# Patient Record
Sex: Female | Born: 2000 | Race: White | Hispanic: No | Marital: Single | Smoking: Former smoker
Health system: Southern US, Community
[De-identification: ages and names within clinical notes are randomized; demographics above are authoritative.]

## PROBLEM LIST (undated history)

## (undated) DIAGNOSIS — F329 Major depressive disorder, single episode, unspecified: Secondary | ICD-10-CM

## (undated) DIAGNOSIS — F332 Major depressive disorder, recurrent severe without psychotic features: Secondary | ICD-10-CM

## (undated) DIAGNOSIS — F32A Depression, unspecified: Secondary | ICD-10-CM

## (undated) HISTORY — DX: Depression, unspecified: F32.A

## (undated) HISTORY — DX: Major depressive disorder, single episode, unspecified: F32.9

## (undated) HISTORY — DX: Major depressive disorder, recurrent severe without psychotic features: F33.2

## (undated) HISTORY — PX: TONSILLECTOMY: SUR1361

---
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
## 2020-02-09 ENCOUNTER — Encounter: Payer: Self-pay | Admitting: Psychiatry

## 2020-03-20 ENCOUNTER — Other Ambulatory Visit: Payer: Self-pay | Admitting: Family Medicine

## 2020-04-03 ENCOUNTER — Telehealth: Payer: Self-pay

## 2020-04-03 NOTE — Telephone Encounter (Signed)
Patient is requesting to be worked in sooner to discuss putting her on anxiety medication. Please advise

## 2020-04-04 NOTE — Telephone Encounter (Signed)
Lvm for pt to call the office back. 

## 2020-04-06 NOTE — Telephone Encounter (Signed)
2nd attempt to contact pt.

## 2020-04-06 NOTE — Telephone Encounter (Signed)
She was followed by Dr. Marlyne Beards and I also did referral to psychiatry. Did she ever follow up with them? Can maybe do next week?  Dr. Artis Flock

## 2020-04-11 NOTE — Telephone Encounter (Signed)
3rd attempt to reach pt. Lvm for pt to call the office back.

## 2020-04-17 ENCOUNTER — Ambulatory Visit: Payer: 59 | Admitting: Family Medicine

## 2020-04-17 DIAGNOSIS — Z0289 Encounter for other administrative examinations: Secondary | ICD-10-CM

## 2020-05-02 ENCOUNTER — Other Ambulatory Visit: Payer: Self-pay | Admitting: Family Medicine

## 2020-09-19 ENCOUNTER — Telehealth: Payer: Self-pay

## 2020-09-19 NOTE — Telephone Encounter (Signed)
Pt mother called stating pt is experiencing a fever and sore throat. Pts brother currently has strep and mother thinks pt may have strep now. Mother is asking if an antibiotic can be sent in for the pt. Please advise.

## 2020-09-19 NOTE — Telephone Encounter (Signed)
Spoke with patient mom, patient at Urgent Care at this time

## 2020-09-21 ENCOUNTER — Other Ambulatory Visit: Payer: Self-pay

## 2020-09-21 ENCOUNTER — Emergency Department (HOSPITAL_BASED_OUTPATIENT_CLINIC_OR_DEPARTMENT_OTHER): Payer: 59 | Admitting: Radiology

## 2020-09-21 ENCOUNTER — Emergency Department (HOSPITAL_BASED_OUTPATIENT_CLINIC_OR_DEPARTMENT_OTHER)
Admission: EM | Admit: 2020-09-21 | Discharge: 2020-09-21 | Disposition: A | Discharge status: Home / Self Care | Payer: 59 | Attending: Emergency Medicine | Admitting: Emergency Medicine

## 2020-09-21 ENCOUNTER — Encounter (HOSPITAL_BASED_OUTPATIENT_CLINIC_OR_DEPARTMENT_OTHER): Payer: Self-pay | Admitting: Obstetrics and Gynecology

## 2020-09-21 DIAGNOSIS — Z87891 Personal history of nicotine dependence: Secondary | ICD-10-CM | POA: Insufficient documentation

## 2020-09-21 DIAGNOSIS — Z20822 Contact with and (suspected) exposure to covid-19: Secondary | ICD-10-CM | POA: Insufficient documentation

## 2020-09-21 DIAGNOSIS — J1089 Influenza due to other identified influenza virus with other manifestations: Secondary | ICD-10-CM | POA: Insufficient documentation

## 2020-09-21 DIAGNOSIS — R059 Cough, unspecified: Secondary | ICD-10-CM | POA: Diagnosis present

## 2020-09-21 DIAGNOSIS — J101 Influenza due to other identified influenza virus with other respiratory manifestations: Secondary | ICD-10-CM

## 2020-09-21 LAB — RESP PANEL BY RT-PCR (FLU A&B, COVID) ARPGX2
Influenza A by PCR: POSITIVE — AB
Influenza B by PCR: NEGATIVE
SARS Coronavirus 2 by RT PCR: NEGATIVE

## 2020-09-21 MED ORDER — HYDROCOD POLST-CPM POLST ER 10-8 MG/5ML PO SUER: 5.0000 mL | Freq: Every evening | ORAL | 0 refills | Status: DC | PRN

## 2020-09-21 MED ORDER — ONDANSETRON 4 MG PO TBDP: 4.0000 mg | ORAL_TABLET | Freq: Three times a day (TID) | ORAL | 0 refills | Status: DC | PRN

## 2020-09-21 MED ORDER — KETOROLAC TROMETHAMINE 60 MG/2ML IM SOLN
30.0000 mg | Freq: Once | INTRAMUSCULAR | Status: AC
  Administered 2020-09-21: 30 mg via INTRAMUSCULAR
  Filled 2020-09-21: qty 2

## 2020-09-21 MED ORDER — PROMETHAZINE-DM 6.25-15 MG/5ML PO SYRP: 5.0000 mL | ORAL_SOLUTION | Freq: Four times a day (QID) | ORAL | 0 refills | Status: DC | PRN

## 2020-09-21 NOTE — ED Provider Notes (Signed)
[facility] EMERGENCY DEPT Provider Note   CSN: [id number] Arrival date & time: 09/21/20  1252     History Chief Complaint  Patient presents with  . Cough  . Generalized Body Aches    Michele Watson is a 20 y.o. female.  The history is provided by the patient.  URI Presenting symptoms: congestion, cough, fever, rhinorrhea and sore throat   Presenting symptoms: no ear pain   Severity:  Moderate Onset quality:  Gradual Duration:  4 days Timing:  Constant Progression:  Worsening Chronicity:  New Relieved by:  Nothing Worsened by:  Nothing Ineffective treatments:  OTC medications (antibiotics given at urgent care for possible strep throat despite negative test) Associated symptoms: no arthralgias   Risk factors: sick contacts (boyfriend and brother with colds)        Past Medical History:  Diagnosis Date  . Depression   . MDD (major depressive disorder), recurrent episode, severe (HCC) 02/16/2018    Patient Active Problem List   Diagnosis Date Noted  . Genital HSV 03/15/2019  . Depression, major, single episode, moderate (HCC) 03/17/2018  . Generalized anxiety disorder 03/17/2018  . 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 Suicide attempt by benzodiazepine overdose (HCC) 02/17/2018  . 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 No pertinent surgical history.   OB History   No obstetric history on file.     Family History  Problem Relation Age of Onset  . Asthma Father   . Cancer Maternal Grandmother        breast  . Arthritis Neg Hx   . Heart disease Neg Hx   . Hyperlipidemia Neg Hx   . Hypertension Neg Hx   . Kidney disease Neg Hx   . Alcohol abuse Neg Hx   . Birth defects Neg Hx   . COPD Neg Hx   . Depression Neg Hx   . Diabetes Neg Hx   . Drug abuse Neg Hx   . Early death Neg Hx   . Hearing loss Neg Hx   . Learning disabilities Neg Hx   . Mental illness Neg  Hx   . Mental retardation Neg Hx   . Miscarriages / Stillbirths Neg Hx   . Stroke Neg Hx   . Vision loss Neg Hx     Social History   Tobacco Use  . Smoking status: Former Smoker    Types: Cigarettes, E-cigarettes  . Smokeless tobacco: Never Used  Vaping Use  . Vaping Use: Some days  . Substances: Nicotine  Substance Use Topics  . Alcohol use: Yes    Comment: "drink til I black out"  . Drug use: Yes    Types: Marijuana, Oxycodone    Home Medications Prior to Admission medications   Medication Sig Start Date End Date Taking? Authorizing Provider  acetaminophen (TYLENOL) 325 MG tablet Take 325-650 mg by mouth every 6 (six) hours as needed (for pain or cramps).    [provider]  acyclovir (ZOVIRAX) 400 MG tablet Take 400 mg by mouth every 8 (eight) hours. 12/03/18   [provider]  Adapalene-Benzoyl Peroxide 0.1-2.5 % gel APPLY 1 PUMP TOPICALLY EVERY NIGHT 03/20/20   Orland Mustard, MD  BLISOVI 24 FE 1-20 MG-MCG(24) tablet TAKE 1 TABLET BY MOUTH EVERY DAY 05/03/20   Orland Mustard, MD  nystatin-triamcinolone (MYCOLOG II) cream APPLY TO THE AFFECTED AREA(S) BY TOPICAL ROUTE 2 TIMES PER DAY FOR 10 14 DAYS 12/04/18   [provider]    Allergies    Patient has no known allergies.  Review of Systems   Review of Systems  Constitutional: Positive for fever. Negative for chills.  HENT: Positive for congestion, rhinorrhea and sore throat. Negative for ear pain.   Eyes: Negative for pain and visual disturbance.  Respiratory: Positive for cough. Negative for shortness of breath.   Cardiovascular: Negative for chest pain and palpitations.  Gastrointestinal: Positive for nausea and vomiting. Negative for abdominal pain.  Genitourinary: Negative for dysuria and hematuria.  Musculoskeletal: Negative for arthralgias and back pain.  Skin: Negative for color change and rash.  Neurological: Negative for seizures and syncope.  All other systems reviewed and are  negative.   Physical Exam Updated Vital Signs BP 110/73 (BP Location: Right Arm)   Pulse (!) 123   Temp 99.5 F (37.5 C) (Oral)   Resp 20   LMP 08/31/2020 (Approximate)   SpO2 100%   Physical Exam Vitals and nursing note reviewed.  Constitutional:      General: She is not in acute distress.    Appearance: She is well-developed.  HENT:     Head: Normocephalic and atraumatic.  Eyes:     Conjunctiva/sclera: Conjunctivae normal.  Cardiovascular:     Rate and Rhythm: Normal rate and regular rhythm.     Heart sounds: No murmur heard.   Pulmonary:     Effort: Pulmonary effort is normal. No respiratory distress.     Breath sounds: Normal breath sounds.  Musculoskeletal:     Cervical back: Neck supple.  Skin:    General: Skin is warm and dry.  Neurological:     General: No focal deficit present.     Mental Status: She is alert.  Psychiatric:        Mood and Affect: Mood normal.        Behavior: Behavior normal.     ED Results / Procedures / Treatments   Labs (all labs ordered are listed, but only abnormal results are displayed) Labs Reviewed  RESP PANEL BY RT-PCR (FLU A&B, COVID) ARPGX2 - Abnormal; Notable for the following components:      Result Value   Influenza A by PCR POSITIVE (*)    All other components within normal limits    EKG None  Radiology DG Chest 2 View  Result Date: 09/21/2020 CLINICAL DATA:  Cough and wheezing.  Short of breath EXAM: CHEST - 2 VIEW COMPARISON:  None. FINDINGS: The heart size and mediastinal contours are within normal limits. Both lungs are clear. The visualized skeletal structures are unremarkable. IMPRESSION: No active cardiopulmonary disease. Electronically Signed   By: Marlan Palau M.D.   On: 09/21/2020 13:19    Procedures Procedures   Medications Ordered in ED Medications  ketorolac (TORADOL) injection 30 mg (has no administration in time range)    ED Course  I have reviewed the triage vital signs and the nursing  notes.  Pertinent labs & imaging results that were available during my care of the patient were reviewed by me and considered in my medical decision making (see chart for details).    MDM Rules/Calculators/A&P                          Michele Watson is generally healthy.  She was noted to be slightly tachycardic which is likely related to an elevated body temperature.  She presented with flulike symptoms and tested positive for the flu.  She is outside  the window where treatment would be indicated.  No evidence of pneumonia on x-ray.  She was given instructions on symptomatic management. Final Clinical Impression(s) / ED Diagnoses Final diagnoses:  Influenza A    Rx / DC Orders ED Discharge Orders         Ordered    ondansetron (ZOFRAN ODT) 4 MG disintegrating tablet  Every 8 hours PRN        09/21/20 1420    chlorpheniramine-HYDROcodone (TUSSIONEX PENNKINETIC ER) 10-8 MG/5ML SUER  At bedtime PRN        09/21/20 1420           Koleen Distance, MD 09/21/20 1422

## 2020-09-21 NOTE — ED Triage Notes (Signed)
Patient reports to the ER for Flu like symptoms. Patient reports cough, generalized body aches, and wheezing. Patient in NAD at this time. Denies COVID exposures. Reports negative home test

## 2020-09-22 ENCOUNTER — Telehealth: Payer: Self-pay

## 2020-09-22 NOTE — Telephone Encounter (Signed)
Transition Care Management Unsuccessful Follow-up Telephone Call  Date of discharge and from where:  [facility]  Attempts:  1st Attempt  Reason for unsuccessful TCM follow-up call:  Left voice message

## 2020-09-25 NOTE — Telephone Encounter (Signed)
Transition Care Management Follow-up Telephone Call  Date of discharge and from where: 09/21/2020 from [facility]  How have you been since you were released from the hospital? Pt stated they are feeling better today and has no questions or concerns.   Any questions or concerns? No  Items Reviewed:  Did the pt receive and understand the discharge instructions provided? Yes   Medications obtained and verified? Yes   Other? No   Any new allergies since your discharge? No   Dietary orders reviewed? n/a  Do you have support at home? Yes   Functional Questionnaire: (I = Independent and D = Dependent) ADLs: I  Bathing/Dressing- I  Meal Prep- I  Eating- I  Maintaining continence- I  Transferring/Ambulation- I  Managing Meds- I  Follow up appointments reviewed:   PCP Hospital f/u appt confirmed? No    Specialist Hospital f/u appt confirmed? No    Are transportation arrangements needed? No   If their condition worsens, is the pt aware to call PCP or go to the Emergency Dept.? Yes  Was the patient provided with contact information for the PCP's office or ED? Yes  Was to pt encouraged to call back with questions or concerns? Yes

## 2021-07-24 ENCOUNTER — Encounter: Payer: Self-pay | Admitting: Family

## 2021-07-24 ENCOUNTER — Ambulatory Visit (INDEPENDENT_AMBULATORY_CARE_PROVIDER_SITE_OTHER): Payer: 59 | Admitting: Family

## 2021-07-24 VITALS — BP 115/74 | HR 96 | Temp 98.7°F | Ht 65.0 in | Wt 120.4 lb

## 2021-07-24 DIAGNOSIS — Z0000 Encounter for general adult medical examination without abnormal findings: Secondary | ICD-10-CM

## 2021-07-24 NOTE — Patient Instructions (Signed)
It was very nice to meet you today! ? ?Let us know if you have any future concerns, and good luck with nursing school! ? ? ? ? ?PLEASE NOTE: ? ?If you had any lab tests please let us know if you have not heard back within a few days. You may see your results on MyChart before we have a chance to review them but we will give you a call once they are reviewed by [place]. If we ordered any referrals today, please let us know if you have not heard from their office within the next week.  ? ?Please try these tips to maintain a healthy lifestyle: ? ?Eat most of your calories during the day when you are active. Eliminate processed foods including packaged sweets (pies, cakes, cookies), reduce intake of potatoes, white bread, white pasta, and white rice. Look for whole grain options, oat flour or almond flour. ? ?Each meal should contain half fruits/vegetables, one quarter protein, and one quarter carbs (no bigger than a computer mouse). ? ?Cut down on sweet beverages. This includes juice, soda, and sweet tea. Also watch fruit intake, though this is a healthier sweet option, it still contains natural sugar! Limit to 3 servings daily. ? ?Drink at least 1 glass of water with each meal and aim for at least 8 glasses per day ? ?Exercise at least 150 minutes every week.  ? ?

## 2021-07-24 NOTE — Progress Notes (Signed)
?Phone [phone] ?  ?Subjective:  ? ?Patient is a 21 y.o. female presenting for annual physical.   ? ?Chief Complaint  ?Patient presents with  ? Transitions Of Care  ? Annual Exam  ? ? ?See problem oriented charting- ?ROS- full  review of systems was completed and negative ? ? ?The following were reviewed and entered/updated in epic: ?Past Medical History:  ?Diagnosis Date  ? Depression   ? MDD (major depressive disorder), recurrent episode, severe ([place]) 02/16/2018  ? ?Patient Active Problem List  ? Diagnosis Date Noted  ? 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 Depression, major, single episode, moderate ([place]) 03/17/2018  ? Generalized anxiety disorder 03/17/2018  ? 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 Suicide attempt by benzodiazepine overdose ([place]) 02/17/2018  ? Psychoactive substance-induced mood disorder ([place]) 02/17/2018  ? Murmur 12/16/2011  ? ?Past Surgical History:  ?Procedure Laterality Date  ? TONSILLECTOMY    ? ? ?Family History  ?Problem Relation Age of Onset  ? Asthma Father   ? Cancer Maternal Grandmother   ?     breast  ? Arthritis Neg Hx   ? Heart disease Neg Hx   ? Hyperlipidemia Neg Hx   ? Hypertension Neg Hx   ? Kidney disease Neg Hx   ? Alcohol abuse Neg Hx   ? Birth defects Neg Hx   ? COPD Neg Hx   ? Depression Neg Hx   ? Diabetes Neg Hx   ? Drug abuse Neg Hx   ? Early death Neg Hx   ? Hearing loss Neg Hx   ? Learning disabilities Neg Hx   ? Mental illness Neg Hx   ? Mental retardation Neg Hx   ? Miscarriages / Stillbirths Neg Hx   ? Stroke Neg Hx   ? Vision loss Neg Hx   ? ? ?Medications- reviewed and updated ?Current Outpatient Medications  ?Medication Sig Dispense Refill  ? acetaminophen (TYLENOL) 325 MG tablet Take 325-650 mg by mouth every 6 (six) hours as needed (for pain or cramps).    ? norethindrone-ethinyl estradiol-FE (LOESTRIN FE) 1-20 MG-MCG tablet Take 1 tablet by mouth daily.    ? ?No current facility-administered medications for this visit.   ? ? ?Allergies-reviewed and updated ?No Known Allergies ? ?Social History  ? ?Social History Narrative  ? Not on file  ? ?Objective  ?Objective:  ?BP 115/74 (BP Location: Left Arm, Patient Position: Sitting, Cuff Size: Normal)   Pulse 96   Temp 98.7 ?F (37.1 ?C) (Temporal)   Ht _0  (1.651 m)   Wt 120 lb 6 oz (54.6 kg)   LMP 07/11/2021 (Approximate)   SpO2 99%   BMI 20.03 kg/m?  ?Physical Exam ?Vitals and nursing note reviewed.  ?Constitutional:   ?   Appearance: Normal appearance.  ?HENT:  ?   Head: Normocephalic.  ?   Right Ear: Tympanic membrane normal.  ?   Left Ear: Tympanic membrane normal.  ?   Nose: Nose normal.  ?   Mouth/Throat:  ?   Mouth: Mucous membranes are moist.  ?Eyes:  ?   Pupils: Pupils are equal, round, and reactive to light.  ?Cardiovascular:  ?   Rate and Rhythm: Normal rate and regular rhythm.  ?Pulmonary:  ?   Effort: Pulmonary effort is normal.  ?   Breath sounds: Normal breath sounds.  ?Musculoskeletal:     ?   General: Normal range of motion.  ?   Cervical back: Normal range  of motion.  ?Lymphadenopathy:  ?   Cervical: No cervical adenopathy.  ?Skin: ?   General: Skin is warm and dry.  ?Neurological:  ?   Mental Status: She is alert.  ?Psychiatric:     ?   Mood and Affect: Mood normal.     ?   Behavior: Behavior normal.  ? ? ?  ?Assessment and Plan  ? ?Health Maintenance counseling: ?1. Anticipatory guidance: Patient counseled regarding regular dental exams q6 months, eye exams,  avoiding smoking and second hand smoke, limiting alcohol to 1 beverage per day, no illicit drugs.   ?2. Risk factor reduction:  Advised patient of need for regular exercise and diet rich with fruits and vegetables to reduce risk of heart attack and stroke. ?Wt Readings from Last 3 Encounters:  ?07/24/21 120 lb 6 oz (54.6 kg)  ?03/15/19 118 lb 12.8 oz (53.9 kg) (38 %, Z= -0.32)*  ?04/29/18 121 lb 4.1 oz (55 kg) (47 %, Z= -0.07)*  ? ?* Growth percentiles are based on CDC (Girls, 2-20 Years) data.  ? ?3.  Immunizations/screenings/ancillary studies ?Immunization History  ?Administered Date(s) Administered  ? DTaP 01/05/2001, 03/12/2001, 05/13/2001, 02/01/2002, 02/03/2006  ? Hepatitis A 11/09/2007  ? Hepatitis A, Ped/Adol-2 Dose 11/16/2012  ? Hepatitis B 11-30-00, 12/03/2000, 05/13/2001  ? HiB (PRP-OMP) 01/05/2001, 05/13/2001, 02/01/2002  ? IPV 01/16/2001, 03/12/2001, 11/02/2001, 02/03/2006  ? Influenza Nasal 02/02/2008, 03/22/2008, 02/12/2011, 04/23/2012  ? Influenza,Quad,Nasal, Live 03/23/2013, 03/12/2014  ? Influenza,inj,Quad PF,6+ Mos 03/01/2016, 01/13/2018  ? Influenza,inj,quad, With Preservative 04/06/2015  ? MMR 11/02/2001, 02/03/2006  ? Meningococcal Conjugate 11/16/2012, 11/18/2016  ? PPD Test 01/13/2018  ? Pneumococcal Conjugate-13 06/14/2002, 11/10/2002  ? Tdap 11/16/2012  ? Varicella 11/02/2001, 11/09/2007  ? ?Health Maintenance Due  ?Topic Date Due  ? COVID-19 Vaccine (1) Never done  ? HPV VACCINES (1 - 2-dose series) Never done  ? Hepatitis C Screening  Never done  ? CHLAMYDIA SCREENING  02/18/2019  ?  ?4. Cervical cancer screening: not needed till next year, pt under GYN care ?5. Skin cancer screening- advised regular sunscreen use. Denies worrisome, changing, or new skin lesions.  ?6. Birth control/STD check: OCPs ?7. Smoking associated screening: non- smoker ?8. Alcohol screening: rarely ? ?Problem List Items Addressed This Visit   ?None ?Visit Diagnoses   ? ? Annual physical exam    -  Primary  ?pt not wanting labs today, performed last year and all wnl. under care of GYN, will have PAP done next year. No concerns today. ?  ? ? ?Recommended follow up: Return for any future concerns. ?No future appointments. ? ?Lab/Order associations:fasting ? ?  ?Jeanie Sewer, NP ? ? ?

## 2022-01-14 ENCOUNTER — Encounter: Payer: Self-pay | Admitting: *Deleted

## 2022-01-23 ENCOUNTER — Other Ambulatory Visit: Payer: Self-pay | Admitting: Family Medicine

## 2022-04-04 ENCOUNTER — Encounter: Payer: Self-pay | Admitting: *Deleted

## 2022-04-10 IMAGING — DX DG CHEST 2V
2 series · 2 of 2 positions shown · non-contrast
Comparison: None.

CLINICAL DATA: Cough and wheezing.  Short of breath

EXAM:
CHEST - 2 VIEW

[chest pa]
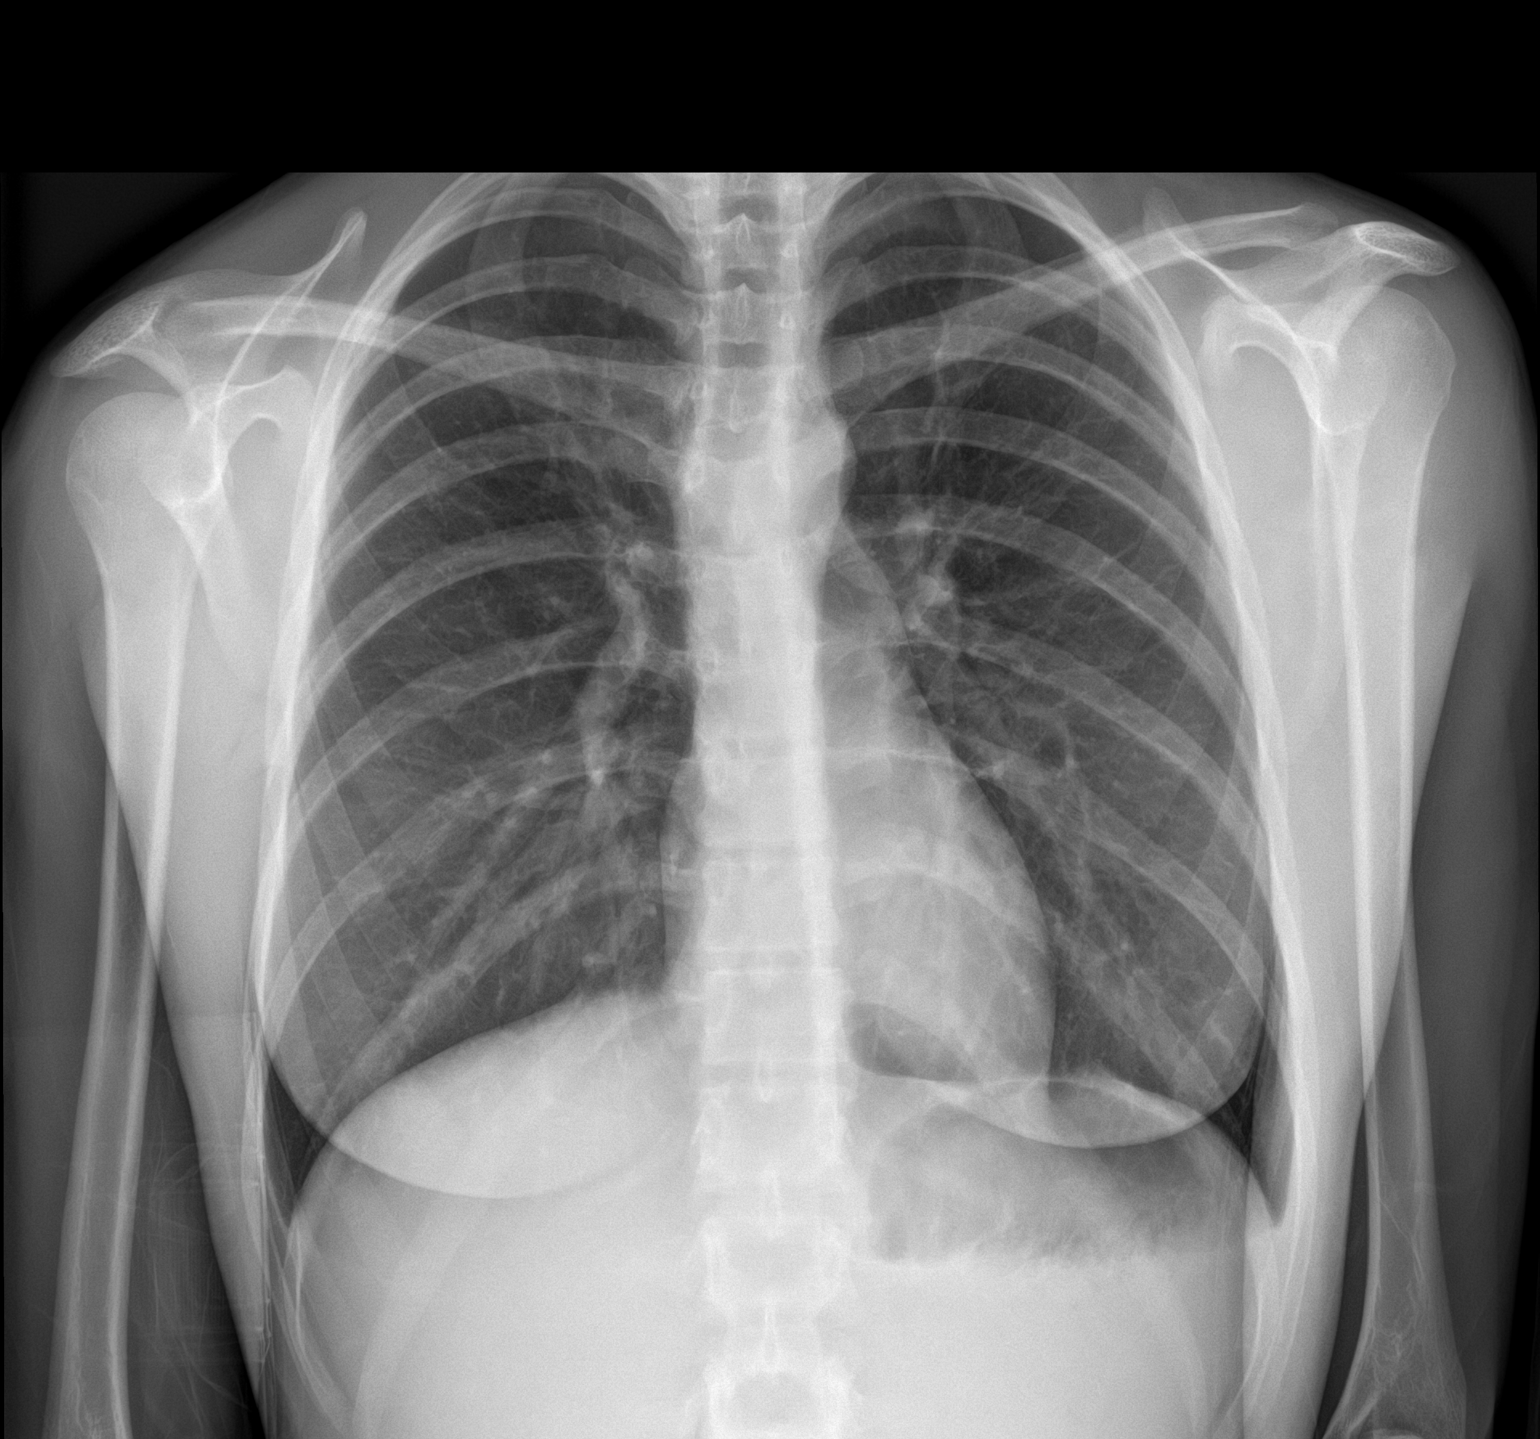

[chest lat]
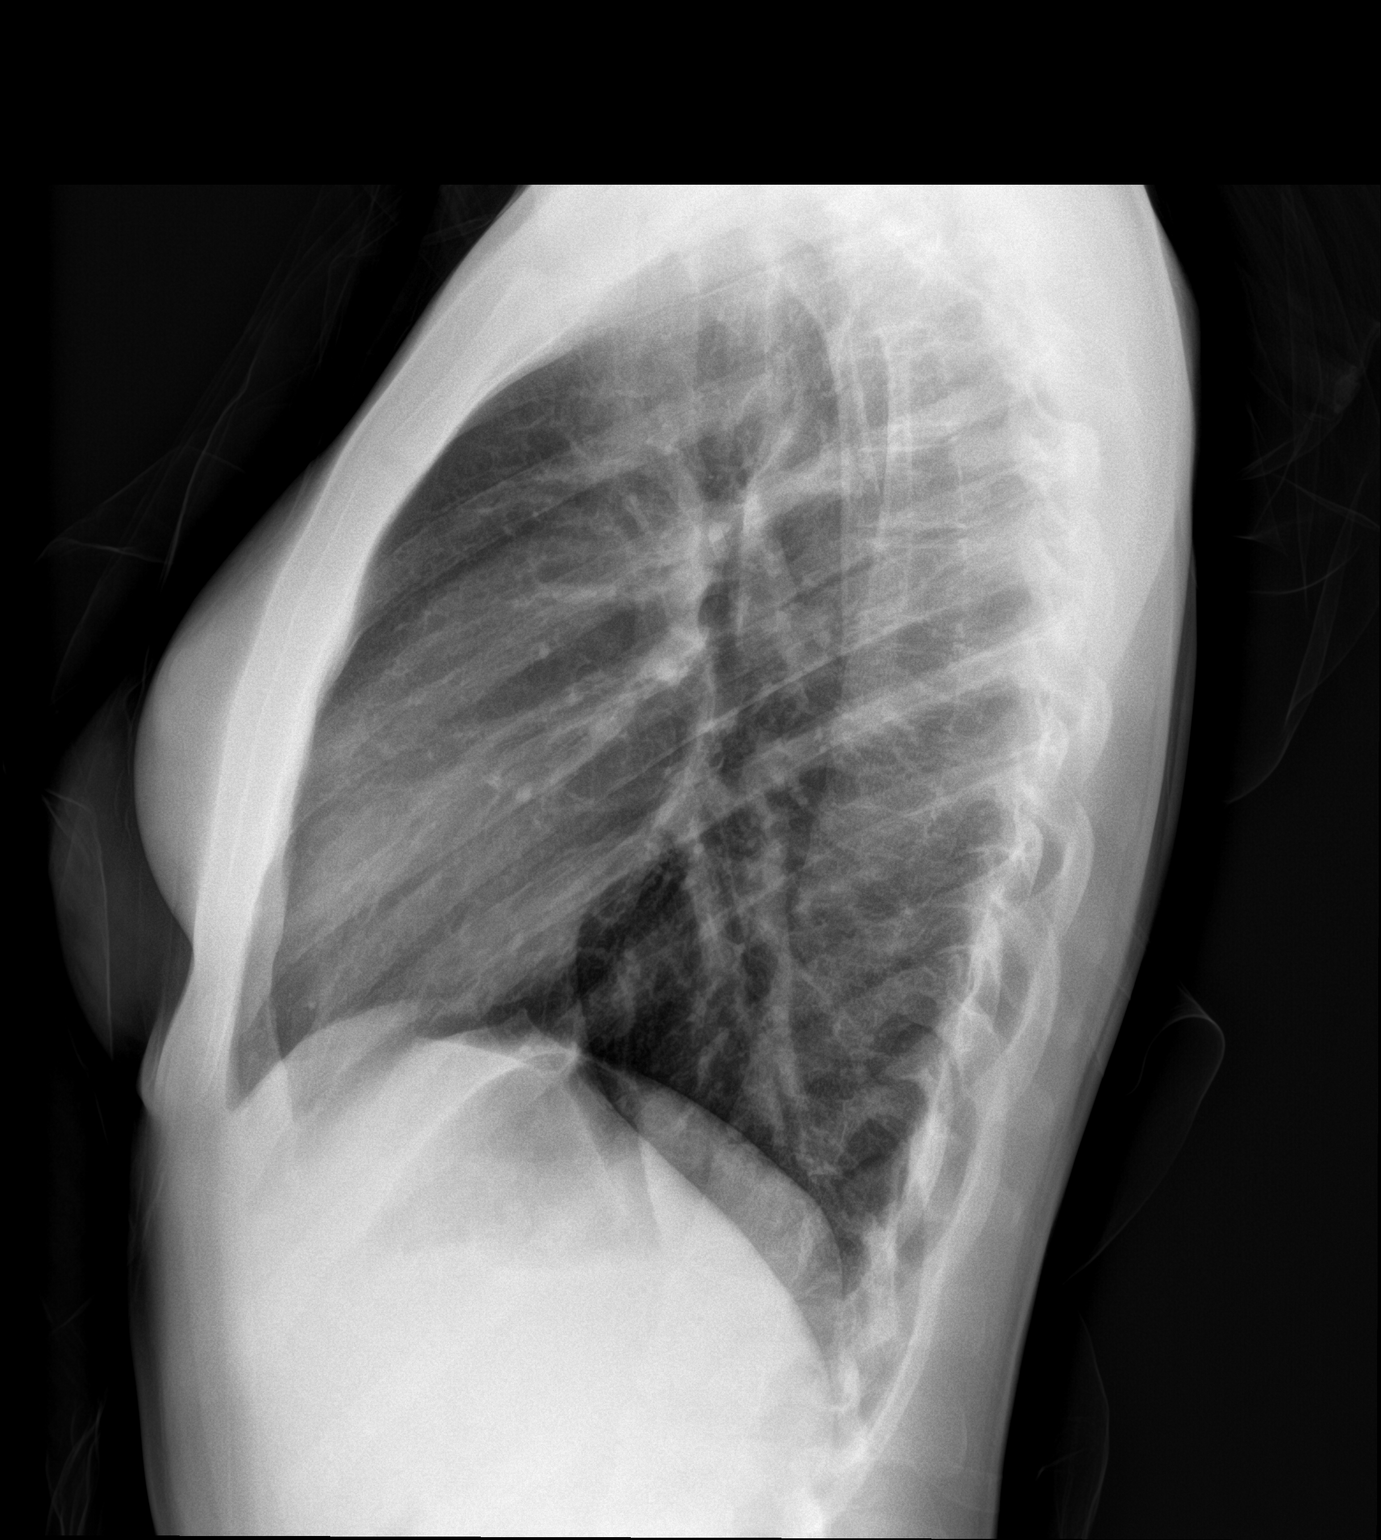

[2 of 2 positions shown; findings below may reference images not displayed]

FINDINGS: The heart size and mediastinal contours are within normal limits.
Both lungs are clear. The visualized skeletal structures are
unremarkable.
IMPRESSION: No active cardiopulmonary disease.

## 2024-03-16 ENCOUNTER — Emergency Department (HOSPITAL_BASED_OUTPATIENT_CLINIC_OR_DEPARTMENT_OTHER)
Admission: EM | Admit: 2024-03-16 | Discharge: 2024-03-16 | Disposition: A | Discharge status: Home / Self Care | Source: Ambulatory Visit | Attending: Emergency Medicine | Admitting: Emergency Medicine

## 2024-03-16 ENCOUNTER — Encounter (HOSPITAL_BASED_OUTPATIENT_CLINIC_OR_DEPARTMENT_OTHER): Payer: Self-pay | Admitting: Emergency Medicine

## 2024-03-16 ENCOUNTER — Other Ambulatory Visit: Payer: Self-pay

## 2024-03-16 ENCOUNTER — Emergency Department (HOSPITAL_BASED_OUTPATIENT_CLINIC_OR_DEPARTMENT_OTHER): Admitting: Radiology

## 2024-03-16 DIAGNOSIS — M79601 Pain in right arm: Secondary | ICD-10-CM | POA: Insufficient documentation

## 2024-03-16 DIAGNOSIS — Z87891 Personal history of nicotine dependence: Secondary | ICD-10-CM | POA: Insufficient documentation

## 2024-03-16 NOTE — ED Provider Notes (Signed)
 [place] EMERGENCY DEPARTMENT AT [facility] Provider Note   CSN: [id number] Arrival date & time: 03/16/24  1545     Patient presents with: Arm Injury   Michele Watson is a 23 y.o. female.   23 year old female presenting with right arm pain.  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  She says at the time of her injury she heard a pop in her shoulder.  She is concerned that her symptoms persist despite the injury occurring yesterday.  No other injuries to report.   Arm Injury      Prior to Admission medications   Medication Sig Start Date End Date Taking? Authorizing Provider  acetaminophen  (TYLENOL ) 325 MG tablet Take 325-650 mg by mouth every 6 (six) hours as needed (for pain or cramps).    [provider]  BLISOVI 24 FE 1-20 MG-MCG(24) tablet TAKE 1 TABLET BY MOUTH EVERY DAY 01/29/22   Hudnell, Corean, NP  norethindrone-ethinyl estradiol-FE (LOESTRIN FE) 1-20 MG-MCG tablet Take 1 tablet by mouth daily.    [provider]    Allergies: Patient has no known allergies.    Review of Systems  Updated Vital Signs  Vitals:   03/16/24 1552  BP: 126/89  Pulse: 88  Resp: 17  Temp: 98.1 F (36.7 C)  TempSrc: Oral  SpO2: 100%     Physical Exam Vitals and nursing note reviewed.  HENT:     Head: Normocephalic.  Eyes:     Extraocular Movements: Extraocular movements intact.  Cardiovascular:     Rate and Rhythm: Normal rate.  Pulmonary:     Effort: Pulmonary effort is normal.  Musculoskeletal:     Cervical back: Normal range of motion.     Comments: RUE: No appreciable bruising/swelling/deformity at the shoulder. Range of motion at the shoulder is in-tact, with pain elicited by  overhead reach and internal rotation.  Full range of motion of the elbow/wrist without pain.  2+ radial pulse.  Endorses diminished sensation to RUE compared to LUE, does not follow any specific dermatomal distribution.  Skin:    General: Skin is warm and dry.  Neurological:     Mental Status: She is alert and oriented to person, place, and time.     (all labs ordered are listed, but only abnormal results are displayed) Labs Reviewed - No data to display  EKG: None  Radiology: DG Shoulder Right Result Date: 03/16/2024 CLINICAL DATA:  Pain EXAM: RIGHT SHOULDER - 2+ VIEW COMPARISON:  None Available. FINDINGS: No acute fracture or dislocation. There is no evidence of arthropathy or other focal bone abnormality. Soft tissues are unremarkable. IMPRESSION: No acute fracture or dislocation. Electronically Signed   By: Rogelia Myers M.D.   On: 03/16/2024 16:49     Procedures   Medications Ordered in the ED - No data to display                                  Medical Decision Making This patient presents to the ED for concern of right arm pain, this involves an extensive number of treatment options, and is a complaint that carries with it a high risk of complications and morbidity.  The differential diagnosis  includes fracture, dislocation, contusion, ligamentous/tendinous injury, muscle sprain/strain/pain   Imaging Studies ordered:  I ordered imaging studies including R shoulder XR  I independently visualized and interpreted imaging which showed No acute fracture or dislocation.  I agree with the radiologist interpretation   Cardiac Monitoring: / EKG:  The patient was maintained on a cardiac monitor.  I personally viewed and interpreted the cardiac monitored which showed an underlying rhythm of: NSR   Problem List / ED Course / Critical interventions / Medication management I have reviewed the patients home medicines and have made adjustments as needed   Social  Determinants of Health:  Former tobacco use   Test / Admission - Considered:  Physical exam is notable as above, no obvious swelling/deformity, range of motion is intact however overhead reach and internal rotation of the shoulder does elicit some discomfort, patient reports diminished sensation in the right upper extremity as compared to the left, she has a 2+ radial pulse with normal capillary refill, suspect this may be reflective of some sort of nerve impingement secondary to underlying ligamentous/tendinous injury of the shoulder given the persistent symptoms.  At this time I do not feel that further imaging is warranted given largely reassuring physical exam findings, I recommend that she continue ibuprofen /Tylenol  as needed for pain, I will provide her with the contact information for an orthopedic specialist to schedule follow-up in the event that her symptoms persist.  She is in agreement with this plan and is appropriate for discharge at this time.    Amount and/or Complexity of Data Reviewed Radiology: ordered.        Final diagnoses:  Right arm pain    ED Discharge Orders     None          Michele Watson SAILOR, [place] 03/16/24 1849    Pamella Ozell LABOR, DO 03/24/24 [phone]

## 2024-03-16 NOTE — ED Triage Notes (Addendum)
 States patient grabbed R arm/shoulder when falling yesterday.  States arm is painful when moving and tingling down arm.

## 2024-03-16 NOTE — Discharge Instructions (Addendum)
 Continue Tylenol /ibuprofen  as needed for pain.  I have provided you with the contact information for an orthopedic specialist, please contact their office to schedule follow-up if your symptoms persist.  Return to the emergency department if your symptoms worsen.

## 2024-03-16 NOTE — ED Notes (Signed)
